# Patient Record
Sex: Male | Born: 2000 | Race: White | Hispanic: No | Marital: Single | Smoking: Never smoker
Health system: Southern US, Community
[De-identification: ages and names within clinical notes are randomized; demographics above are authoritative.]

## PROBLEM LIST (undated history)

## (undated) DIAGNOSIS — R011 Cardiac murmur, unspecified: Secondary | ICD-10-CM

## (undated) HISTORY — DX: Cardiac murmur, unspecified: R01.1

---
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
## 2022-09-28 ENCOUNTER — Telehealth: Payer: Self-pay

## 2022-09-28 NOTE — Telephone Encounter (Signed)
LVM for patient to call back. AS, CMA 

## 2023-02-28 ENCOUNTER — Other Ambulatory Visit: Payer: Self-pay

## 2023-02-28 ENCOUNTER — Ambulatory Visit: Payer: Medicaid Other | Admitting: Family Medicine

## 2023-02-28 ENCOUNTER — Encounter (HOSPITAL_COMMUNITY): Payer: Self-pay

## 2023-02-28 ENCOUNTER — Telehealth: Payer: Self-pay | Admitting: Family Medicine

## 2023-02-28 ENCOUNTER — Emergency Department (HOSPITAL_COMMUNITY): Payer: Medicaid Other

## 2023-02-28 ENCOUNTER — Emergency Department (HOSPITAL_COMMUNITY)
Admission: EM | Admit: 2023-02-28 | Discharge: 2023-02-28 | Disposition: A | Discharge status: Home / Self Care | Payer: Medicaid Other

## 2023-02-28 DIAGNOSIS — I517 Cardiomegaly: Secondary | ICD-10-CM | POA: Diagnosis not present

## 2023-02-28 DIAGNOSIS — R0789 Other chest pain: Secondary | ICD-10-CM

## 2023-02-28 DIAGNOSIS — R079 Chest pain, unspecified: Secondary | ICD-10-CM | POA: Diagnosis not present

## 2023-02-28 LAB — TROPONIN I (HIGH SENSITIVITY): Troponin I (High Sensitivity): <2 ng/L (ref <18)

## 2023-02-28 LAB — BASIC METABOLIC PANEL
Anion gap: 8 (ref 5–15)
BUN: 12 mg/dL (ref 6–20)
CO2: 26 mmol/L (ref 22–32)
Calcium: 9.5 mg/dL (ref 8.9–10.3)
Chloride: 103 mmol/L (ref 98–111)
Creatinine, Ser: 0.95 mg/dL (ref 0.61–1.24)
GFR, Estimated: >60 mL/min (ref >60)
Glucose, Bld: 105 mg/dL — ABNORMAL HIGH (ref 70–99)
Potassium: 4 mmol/L (ref 3.5–5.1)
Sodium: 137 mmol/L (ref 135–145)

## 2023-02-28 LAB — CBC WITH DIFFERENTIAL/PLATELET
Abs Immature Granulocytes: 0.01 10*3/uL (ref 0.00–0.07)
Basophils Absolute: 0.1 10*3/uL (ref 0.0–0.1)
Basophils Relative: 1 %
Eosinophils Absolute: 0.1 10*3/uL (ref 0.0–0.5)
Eosinophils Relative: 2 %
HCT: 44.1 % (ref 39.0–52.0)
Hemoglobin: 15.1 g/dL (ref 13.0–17.0)
Immature Granulocytes: 0 %
Lymphocytes Relative: 37 %
Lymphs Abs: 2 10*3/uL (ref 0.7–4.0)
MCH: 29.4 pg (ref 26.0–34.0)
MCHC: 34.2 g/dL (ref 30.0–36.0)
MCV: 86 fL (ref 80.0–100.0)
Monocytes Absolute: 0.5 10*3/uL (ref 0.1–1.0)
Monocytes Relative: 9 %
Neutro Abs: 2.8 10*3/uL (ref 1.7–7.7)
Neutrophils Relative %: 51 %
Platelets: 302 10*3/uL (ref 150–400)
RBC: 5.13 MIL/uL (ref 4.22–5.81)
RDW: 11.9 % (ref 11.5–15.5)
WBC: 5.4 10*3/uL (ref 4.0–10.5)
nRBC: 0 % (ref 0.0–0.2)

## 2023-02-28 NOTE — ED Provider Notes (Signed)
[facility] AT [facility] Provider Note   CSN: [id number] Arrival date & time: 02/28/23  5188     History  Chief Complaint  Patient presents with   Chest Pain    Jeffery Fuentes is a 22 y.o. male with a past medical history significant for IBS, chronic diarrhea, and obesity who presents to the ED due to intermittent chest pain x 1 week.  Last had chest pain 2 days ago.  None since.  Patient states chest pain typically occurs between 3 and 5 PM and then throughout the night.  Describes chest pain as a pressure-like sensation in the center of his chest that radiates to his left shoulder.  No cardiac history.  No associated shortness of breath.  Denies lower extremity edema.  No history of blood clots.  No family history of cardiac issues.  No association with exertion.  No association with positional changes.  Patient admits to lifting heavy things at work.  Patient notes that chest pain typically occurs while at work.  Patient is currently chest pain-free. No fever or chills.   History obtained from patient and past medical records. No interpreter used during encounter.       Home Medications Prior to Admission medications   Not on File      Allergies    Patient has no known allergies.    Review of Systems   Review of Systems  Constitutional:  Negative for chills and fever.  Respiratory:  Negative for shortness of breath.   Cardiovascular:  Positive for chest pain. Negative for leg swelling.  Gastrointestinal:  Negative for abdominal pain.    Physical Exam Updated Vital Signs BP 127/80 (BP Location: Left Arm)   Pulse 69   Temp 98.1 F (36.7 C) (Oral)   Resp 20   Ht 6' 1.5" (1.867 m)   Wt 131.5 kg   SpO2 97%   BMI 37.74 kg/m  Physical Exam Vitals and nursing note reviewed.  Constitutional:      General: He is not in acute distress.    Appearance: He is not ill-appearing.  HENT:     Head: Normocephalic.  Eyes:     Pupils: Pupils  are equal, round, and reactive to light.  Cardiovascular:     Rate and Rhythm: Normal rate and regular rhythm.     Pulses: Normal pulses.     Heart sounds: Normal heart sounds. No murmur heard.    No friction rub. No gallop.  Pulmonary:     Effort: Pulmonary effort is normal.     Breath sounds: Normal breath sounds.  Abdominal:     General: Abdomen is flat. There is no distension.     Palpations: Abdomen is soft.     Tenderness: There is no abdominal tenderness. There is no guarding or rebound.  Musculoskeletal:        General: Normal range of motion.     Cervical back: Neck supple.  Skin:    General: Skin is warm and dry.  Neurological:     General: No focal deficit present.     Mental Status: He is alert.  Psychiatric:        Mood and Affect: Mood normal.        Behavior: Behavior normal.     ED Results / Procedures / Treatments   Labs (all labs ordered are listed, but only abnormal results are displayed) Labs Reviewed  BASIC METABOLIC PANEL - Abnormal; Notable for the following components:  Result Value   Glucose, Bld 105 (*)    All other components within normal limits  CBC WITH DIFFERENTIAL/PLATELET  TROPONIN I (HIGH SENSITIVITY)    EKG EKG Interpretation Date/Time:  Wednesday February 28 2023 09:37:29 EDT Ventricular Rate:  67 PR Interval:  143 QRS Duration:  119 QT Interval:  396 QTC Calculation: 418 R Axis:   8  Text Interpretation: Sinus rhythm Incomplete right bundle branch block Low voltage, precordial leads Confirmed by Estanislado Pandy [phone]) on 02/28/2023 9:41:52 AM  Radiology DG Chest Portable 1 View  Result Date: 02/28/2023 CLINICAL DATA:  One-week history of chest pain EXAM: PORTABLE CHEST 1 VIEW COMPARISON:  None Available. FINDINGS: Normal lung volumes. No focal consolidations. No pleural effusion or pneumothorax. Enlarged cardiomediastinal silhouette is likely projectional. No acute osseous abnormality. IMPRESSION: Enlarged cardiomediastinal  silhouette is likely projectional. Recommend correlation with EKG and/or echocardiography as clinically indicated. Electronically Signed   By: Agustin Cree M.D.   On: 02/28/2023 11:21    Procedures Procedures    Medications Ordered in ED Medications - No data to display  ED Course/ Medical Decision Making/ A&P                                 Medical Decision Making Amount and/or Complexity of Data Reviewed External Data Reviewed: notes.    Details: PCP note Labs: ordered. Decision-making details documented in ED Course. Radiology: ordered and independent interpretation performed. Decision-making details documented in ED Course. ECG/medicine tests: ordered and independent interpretation performed. Decision-making details documented in ED Course.   This patient presents to the ED for concern of CP, this involves an extensive number of treatment options, and is a complaint that carries with it a high risk of complications and morbidity.  The differential diagnosis includes ACS, PE, aortic dissection, MSK etiology, PNA, etc  22 year old male presents to the ED due to intermittent chest pain x 1 week.  No association with exertion or positional changes.  No cardiac history.  No history of blood clots.  No lower extremity edema.  Upon arrival, vitals all within normal limits.  Patient is currently chest pain-free.  No chest pain for the past 2 days.  Patient in no acute distress.  Reassuring physical exam.  Lungs clear to auscultation bilaterally.  No lower extremity edema.  Cardiac labs ordered. PERC negative and low risk using wells criteria, low suspicion for PE/DVT. Presentation non-concerning for aortic dissection.   CBC unremarkable.  No leukocytosis.  Normal hemoglobin.  Troponin normal.  EKG demonstrates normal sinus rhythm.  No signs of acute ischemia.  Low suspicion for ACS.  BMP reassuring.  Normal renal function.  Major electrolyte derangements.   Chest x-ray personally reviewed and  interpreted which demonstrates possible enlarged cardiomediastinal silhouette. No evidence of decompensated CHF on exam. No lower extremity edema. EKG without evidence of LVH. Cardiology referral placed. Could be due to positioning in bed?? Discussed with Dr. Maple Hudson who agrees with assessment and plan and agrees patient is stable for discharge with cardiology follow-up given normal troponin and EKG. Given patient has been chest pain-free for over 2 days with normal troponin low suspicion for cardiac etiology of chest pain.  No infectious symptoms to suggest myocarditis.  Low suspicion for pericarditis.  Presentation nonconcerning for aortic dissection or PE.  Strict ED precautions discussed with patient. Patient states understanding and agrees to plan. Patient discharged home in no acute distress and stable vitals  Lives at home Has PCP Hx obesity       Final Clinical Impression(s) / ED Diagnoses Final diagnoses:  Atypical chest pain  Enlarged heart    Rx / DC Orders ED Discharge Orders          Ordered    Ambulatory referral to Cardiology       Comments: If you have not heard from the Cardiology office within the next 72 hours please call [phone].   02/28/23 1148              Mannie Stabile, PA-C 02/28/23 1159    Coral Spikes, DO 03/01/23 1742

## 2023-02-28 NOTE — Discharge Instructions (Addendum)
It was a pleasure taking care of you today.  As discussed, your labs are reassuring.  Your cardiac marker was normal.  You may take over-the-counter ibuprofen as needed for pain.  Please follow-up with PCP for recheck in 2 to 3 days.  Return to the ER for any new or worsening symptoms.  Your chest x-ray showed a possible enlarged heart. I have placed a referral to cardiology. They will call you to schedule an appointment. Follow-up with PCP in 2-3 days. Return to ER for worsening chest pain

## 2023-02-28 NOTE — ED Triage Notes (Signed)
Pt states he's been having chest pains for about a week. Did start a new job and has been stressed but not enough pain to go to ED. Called dr to schedule an appointment and was recommended to come to ED today. No health history.

## 2023-02-28 NOTE — Telephone Encounter (Signed)
Pt was contacted due to symptoms, was transferred to triage

## 2023-02-28 NOTE — Telephone Encounter (Signed)
Patient Name First: Jeffery Last: Fuentes IS Gender: Male DOB: [birth year] Age: 22 Y 11 M 1 D Return Phone Number: [phone] (Primary) [place] [address] Client [facility] [place] Client Site  [facility] Provider Lezlie Octave- MD Contact Type Call Who Is Calling Patient / Member / Family / Caregiver Call Type Triage / Clinical Relationship To Patient Self Return Phone Number [phone] (Primary) Chief Complaint CHEST PAIN - pain, pressure, heaviness or tightness Reason for Call Symptomatic / Request for Health Information Initial Comment [name] states the pt. is having chest pressure and neck pain. GOTO Facility Not Listed [facility] ED Translation No Nurse Assessment Nurse: Elesa Hacker, RN, Nash Dimmer Date/Time Lamount Cohen Time): 02/28/2023 8:22:20 AM Confirm and document reason for call. If symptomatic, describe symptoms. ---[name] states that he is having chest pressure and neck pain. Has had sx x 1 week. No known injuries. Does the patient have any new or worsening symptoms? ---Yes Will a triage be completed? ---Yes Related visit to physician within the last 2 weeks? ---No Does the PT have any chronic conditions? (i.e. diabetes, asthma, this includes High risk factors for pregnancy, etc.) ---No Is this a behavioral health or substance abuse call? ---No Guidelines Guideline Title Affirmed Question Affirmed Notes Nurse Date/Time (Eastern Time) Chest Pain [1] Chest pain (or "angina") comes and goes AND [2] is happening more often (increasing in frequency) or getting worse (increasing in severity) (Exception: Deaton, RN, Nash Dimmer 02/28/2023 8:23:11 AM PLEASE NOTE: All timestamps contained within this report are represented as [place] Standard Time. CONFIDENTIALTY NOTICE: This fax transmission is intended only for the addressee. It contains information that is legally  privileged, confidential or otherwise protected from use or disclosure. If you are not the intended recipient, you are strictly prohibited from reviewing, disclosing, copying using or disseminating any of this information or taking any action in reliance on or regarding this information. If you have received this fax in error, please notify us immediately by telephone so that we can arrange for its return to [place]. Phone: [phone], Toll-Free: [phone], Fax: [phone] Page: 2 of 2 Call Id: [id number] Guidelines Guideline Title Affirmed Question Affirmed Notes Nurse Date/Time Lamount Cohen Time) Chest pains that last only a few seconds.) Disp. Time Lamount Cohen Time) Disposition Final User 02/28/2023 8:19:01 AM Send to Urgent Queue Talmage Coin 02/28/2023 8:26:15 AM Go to ED Now Yes Elesa Hacker, RN, Nash Dimmer Final Disposition 02/28/2023 8:26:15 AM Go to ED Now Yes Deaton, RN, Cory Roughen Disagree/Comply Comply [name] Understands Yes PreDisposition Did not know what to do Care Advice Given Per Guideline GO TO ED NOW: * You need to be seen in the Emergency Department. * Go to the ED at ___________ Hospital. * Leave now. Drive carefully. ANOTHER ADULT SHOULD DRIVE: * It is better and safer if another adult drives instead of you. CALL 911 IF: * Severe difficulty breathing occurs * Passes out or becomes too weak to stand * You become worse NOTHING BY MOUTH: * Do not eat or drink anything for now. CARE ADVICE given per Chest Pain (Adult) guideline. Comments User: Wandra Scot, RN Date/Time Lamount Cohen Time): 02/28/2023 8:27:18 AM [name] has someone driving him to the ED by POV. Referrals GO TO FACILITY OTHER - SPECIFY

## 2023-02-28 NOTE — Telephone Encounter (Signed)
Noted sent to Dr.Tabori as Lorain Childes

## 2023-03-07 ENCOUNTER — Ambulatory Visit: Payer: Medicaid Other | Admitting: Family Medicine

## 2023-03-07 ENCOUNTER — Encounter: Payer: Self-pay | Admitting: Family Medicine

## 2023-03-07 VITALS — BP 138/74 | HR 68 | Temp 97.9°F | Ht 73.5 in | Wt 290.1 lb

## 2023-03-07 DIAGNOSIS — K219 Gastro-esophageal reflux disease without esophagitis: Secondary | ICD-10-CM | POA: Diagnosis not present

## 2023-03-07 DIAGNOSIS — R0789 Other chest pain: Secondary | ICD-10-CM

## 2023-03-07 DIAGNOSIS — I517 Cardiomegaly: Secondary | ICD-10-CM | POA: Diagnosis not present

## 2023-03-07 MED ORDER — FAMOTIDINE 40 MG PO TABS: 40.0000 mg | ORAL_TABLET | Freq: Every day | ORAL | 3 refills | Status: DC

## 2023-03-07 NOTE — Patient Instructions (Signed)
Schedule your complete physical in 6 months START the Famotidine daily to decrease acid production IF the shoulder starts hurting again, let us know and we can do an ortho referral Call with any questions or concerns Stay Safe!  Stay Healthy! Happy Early [place] Ouch!

## 2023-03-07 NOTE — Progress Notes (Signed)
   Subjective:    Patient ID: Jeffery Fuentes, male    DOB: Dec 27, 2000, 22 y.o.   MRN: [id number]  HPI ER f/u- pt was seen in ER on 10/9 for chest pain.  At the time was having intermittent chest pain for a week.  Pain typically occurs between 3-5pm and then overnight.  Describes central chest pressure that radiates to L shoulder.  ER work up was unremarkable w/ exception of possibly enlarged cardiac silhouette on portable CXR.  Referred to Cardiology.  Currently has been symptom free for 4-5 days.  Denies issues w/ GERD or indigestion.  Pain seems to occur before leaving work and then persists overnight.     Review of Systems For ROS see HPI     Objective:   Physical Exam Vitals reviewed.  Constitutional:      General: He is not in acute distress.    Appearance: Normal appearance. He is well-developed. He is not ill-appearing.  HENT:     Head: Normocephalic and atraumatic.  Eyes:     Extraocular Movements: Extraocular movements intact.     Conjunctiva/sclera: Conjunctivae normal.     Pupils: Pupils are equal, round, and reactive to light.  Neck:     Thyroid: No thyromegaly.  Cardiovascular:     Rate and Rhythm: Normal rate and regular rhythm.     Pulses: Normal pulses.     Heart sounds: Normal heart sounds. No murmur heard. Pulmonary:     Effort: Pulmonary effort is normal. No respiratory distress.     Breath sounds: Normal breath sounds.  Abdominal:     General: Bowel sounds are normal. There is no distension.     Palpations: Abdomen is soft.  Musculoskeletal:     Cervical back: Normal range of motion and neck supple.     Right lower leg: No edema.     Left lower leg: No edema.  Lymphadenopathy:     Cervical: No cervical adenopathy.  Skin:    General: Skin is warm and dry.  Neurological:     General: No focal deficit present.     Mental Status: He is alert and oriented to person, place, and time.     Cranial Nerves: No cranial nerve deficit.  Psychiatric:         Mood and Affect: Mood normal.        Behavior: Behavior normal.           Assessment & Plan:  Chest pressure- new.  Reviewed ER note, labs, imaging.  Given the description of his sxs, I suspect there is a component of GERD and esophageal spasm.  Unlikely to be cardiac in nature given his normal ER work up and young age.  Will start daily Famotidine.  Discussed dietary and lifestyle modifications that can improve sxs.  Pt expressed understanding and is in agreement w/ plan.   Enlarged heart- new.  Noted as a possibility on AP portable xray.  Reviewed that determining cardiomegaly on this type of image is not reliable.  He has Cards referral and appt scheduled for November.  Encouraged him to keep that appt but provided reassurance.  Pt expressed understanding and is in agreement w/ plan.

## 2023-04-06 ENCOUNTER — Ambulatory Visit: Payer: Medicaid Other | Admitting: Internal Medicine

## 2023-06-07 ENCOUNTER — Ambulatory Visit: Payer: Medicaid Other | Admitting: Internal Medicine

## 2023-08-31 ENCOUNTER — Encounter: Payer: Medicaid Other | Admitting: Family Medicine

## 2023-09-05 ENCOUNTER — Encounter: Payer: Medicaid Other | Admitting: Family Medicine

## 2023-11-01 ENCOUNTER — Ambulatory Visit (INDEPENDENT_AMBULATORY_CARE_PROVIDER_SITE_OTHER): Admitting: Family Medicine

## 2023-11-01 ENCOUNTER — Encounter: Payer: Self-pay | Admitting: Family Medicine

## 2023-11-01 VITALS — BP 120/80 | HR 65 | Temp 98.5°F | Ht 74.5 in | Wt 274.8 lb

## 2023-11-01 DIAGNOSIS — E669 Obesity, unspecified: Secondary | ICD-10-CM

## 2023-11-01 DIAGNOSIS — Z0000 Encounter for general adult medical examination without abnormal findings: Secondary | ICD-10-CM | POA: Insufficient documentation

## 2023-11-01 LAB — CBC WITH DIFFERENTIAL/PLATELET
Basophils Absolute: 0.1 10*3/uL (ref 0.0–0.1)
Basophils Relative: 1.1 % (ref 0.0–3.0)
Eosinophils Absolute: 0.1 10*3/uL (ref 0.0–0.7)
Eosinophils Relative: 2 % (ref 0.0–5.0)
HCT: 43.7 % (ref 39.0–52.0)
Hemoglobin: 14.7 g/dL (ref 13.0–17.0)
Lymphocytes Relative: 33.9 % (ref 12.0–46.0)
Lymphs Abs: 2.3 10*3/uL (ref 0.7–4.0)
MCHC: 33.7 g/dL (ref 30.0–36.0)
MCV: 83.9 fl (ref 78.0–100.0)
Monocytes Absolute: 0.5 10*3/uL (ref 0.1–1.0)
Monocytes Relative: 7.2 % (ref 3.0–12.0)
Neutro Abs: 3.8 10*3/uL (ref 1.4–7.7)
Neutrophils Relative %: 55.8 % (ref 43.0–77.0)
Platelets: 327 10*3/uL (ref 150.0–400.0)
RBC: 5.2 Mil/uL (ref 4.22–5.81)
RDW: 12.9 % (ref 11.5–15.5)
WBC: 6.8 10*3/uL (ref 4.0–10.5)

## 2023-11-01 LAB — LIPID PANEL
Cholesterol: 200 mg/dL (ref 0–200)
HDL: 44.6 mg/dL (ref >39.00)
LDL Cholesterol: 139 mg/dL — ABNORMAL HIGH (ref 0–99)
NonHDL: 155.11
Total CHOL/HDL Ratio: 4
Triglycerides: 80 mg/dL (ref 0.0–149.0)
VLDL: 16 mg/dL (ref 0.0–40.0)

## 2023-11-01 LAB — BASIC METABOLIC PANEL WITH GFR
BUN: 15 mg/dL (ref 6–23)
CO2: 30 meq/L (ref 19–32)
Calcium: 10.1 mg/dL (ref 8.4–10.5)
Chloride: 103 meq/L (ref 96–112)
Creatinine, Ser: 0.94 mg/dL (ref 0.40–1.50)
GFR: 115.81 mL/min (ref >60.00)
Glucose, Bld: 105 mg/dL — ABNORMAL HIGH (ref 70–99)
Potassium: 4.4 meq/L (ref 3.5–5.1)
Sodium: 140 meq/L (ref 135–145)

## 2023-11-01 LAB — HEPATIC FUNCTION PANEL
ALT: 24 U/L (ref 0–53)
AST: 21 U/L (ref 0–37)
Albumin: 4.7 g/dL (ref 3.5–5.2)
Alkaline Phosphatase: 82 U/L (ref 39–117)
Bilirubin, Direct: 0.1 mg/dL (ref 0.0–0.3)
Total Bilirubin: 0.4 mg/dL (ref 0.2–1.2)
Total Protein: 7.8 g/dL (ref 6.0–8.3)

## 2023-11-01 NOTE — Progress Notes (Signed)
   Subjective:    Patient ID: Jeffery Fuentes, male    DOB: March 24, 2001, 23 y.o.   MRN: [id number]  HPI CPE- UTD on Tdap.  Declines other vaccines.  Down 15 lbs  Patient Care Team    Relationship Specialty Notifications Start End  Jess Morita, MD PCP - General Family Medicine  12/30/15     Health Maintenance  Topic Date Due   HPV VACCINES (1 - Male 3-dose series) Never done   Meningococcal B Vaccine (1 of 2 - Standard) Never done   COVID-19 Vaccine (1 - 2024-25 season) Never done   Hepatitis C Screening  10/31/2024 (Originally 03/29/2020)   HIV Screening  10/31/2024 (Originally 03/29/2017)   INFLUENZA VACCINE  12/21/2023   DTaP/Tdap/Td (7 - Td or Tdap) 07/16/2024   Pneumococcal Vaccine 39-61 Years old  Completed      Review of Systems Patient reports no vision/hearing changes, anorexia, fever ,adenopathy, persistant/recurrent hoarseness, swallowing issues, chest pain, palpitations, edema, persistant/recurrent cough, hemoptysis, dyspnea (rest,exertional, paroxysmal nocturnal), gastrointestinal  bleeding (melena, rectal bleeding), abdominal pain, excessive heart burn, GU symptoms (dysuria, hematuria, voiding/incontinence issues) syncope, focal weakness, memory loss, numbness & tingling, skin/hair/nail changes, depression, anxiety, abnormal bruising/bleeding, musculoskeletal symptoms/signs.     Objective:   Physical Exam General Appearance:    Alert, cooperative, no distress, appears stated age  Head:    Normocephalic, without obvious abnormality, atraumatic  Eyes:    PERRL, conjunctiva/corneas clear, EOM's intact both eyes       Ears:    Normal TM's and external ear canals, both ears  Nose:   Nares normal, septum midline, mucosa normal, no drainage   or sinus tenderness  Throat:   Lips, mucosa, and tongue normal; teeth and gums normal  Neck:   Supple, symmetrical, trachea midline, no adenopathy;       thyroid :  No enlargement/tenderness/nodules  Back:     Symmetric, no  curvature, ROM normal, no CVA tenderness  Lungs:     Clear to auscultation bilaterally, respirations unlabored  Chest wall:    No tenderness or deformity  Heart:    Regular rate and rhythm, S1 and S2 normal, no murmur, rub   or gallop  Abdomen:     Soft, non-tender, bowel sounds active all four quadrants,    no masses, no organomegaly  Genitalia:    deferred  Rectal:    Extremities:   Extremities normal, atraumatic, no cyanosis or edema  Pulses:   2+ and symmetric all extremities  Skin:   Skin color, texture, turgor normal, no rashes or lesions  Lymph nodes:   Cervical, supraclavicular, and axillary nodes normal  Neurologic:   CNII-XII intact. Normal strength, sensation and reflexes      throughout          Assessment & Plan:

## 2023-11-01 NOTE — Patient Instructions (Signed)
Follow up in 1 year or as needed We'll notify you of your lab results and make any changes if needed Keep up the good work on healthy diet and regular exercise- you look great!!! Call with any questions or concerns Stay Safe!  Stay Healthy! Have a great summer!!! 

## 2023-11-01 NOTE — Assessment & Plan Note (Signed)
 Pt's PE WNL w/ exception of BMI.  Applauded his recent 15 lb weight loss.  Check labs.  Anticipatory guidance provided.

## 2023-11-02 LAB — TSH: TSH: 1.27 u[IU]/mL (ref 0.35–5.50)

## 2023-11-05 ENCOUNTER — Ambulatory Visit: Payer: Self-pay | Admitting: Family Medicine

## 2023-11-05 NOTE — Progress Notes (Signed)
LVM to call about lab results

## 2023-11-06 NOTE — Progress Notes (Signed)
 Pt has reviewed labs via MyChart
